# Patient Record
Sex: Male | Born: 2000
Health system: Southern US, Community
[De-identification: ages and names within clinical notes are randomized; demographics above are authoritative.]

---
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
## 2022-01-23 ENCOUNTER — Ambulatory Visit (INDEPENDENT_AMBULATORY_CARE_PROVIDER_SITE_OTHER): Payer: No Typology Code available for payment source | Admitting: Pediatrics

## 2022-01-23 ENCOUNTER — Other Ambulatory Visit (HOSPITAL_BASED_OUTPATIENT_CLINIC_OR_DEPARTMENT_OTHER): Payer: Self-pay

## 2022-01-23 ENCOUNTER — Encounter: Payer: Self-pay | Admitting: Pediatrics

## 2022-01-23 VITALS — BP 108/64 | HR 92 | Resp 20 | Wt 135.9 lb

## 2022-01-23 DIAGNOSIS — R0789 Other chest pain: Secondary | ICD-10-CM | POA: Diagnosis not present

## 2022-01-23 MED ORDER — CYCLOBENZAPRINE HCL 10 MG PO TABS
10.0000 mg | ORAL_TABLET | Freq: Three times a day (TID) | ORAL | 0 refills | Status: DC | PRN
  Filled 2022-01-23: qty 30, 10d supply, fill #0

## 2022-01-23 MED ORDER — IBUPROFEN 600 MG PO TABS
600.0000 mg | ORAL_TABLET | Freq: Four times a day (QID) | ORAL | 0 refills | Status: DC | PRN
  Filled 2022-01-23: qty 30, 8d supply, fill #0

## 2022-01-24 ENCOUNTER — Encounter: Payer: Self-pay | Admitting: Pediatrics

## 2022-01-24 DIAGNOSIS — R0789 Other chest pain: Secondary | ICD-10-CM | POA: Insufficient documentation

## 2022-01-24 NOTE — Progress Notes (Signed)
Calvin Salas is an 21 y.o. male who presents for evaluation of chest wall pain. Onset was 1 week ago. Symptoms have been stable since that time. The patient describes the pain as aching in the entire chest. Patient rates pain as a 3/10 in intensity. Associated symptoms are: none. Aggravating factors are: deep inspiration, exercise, lifting, palpation of chest. Alleviating factors are: cold and rest. Mechanism of injury: may have been injured by lifting weights. Previous visits for this problem: none. Evaluation to date: none. Treatment to date: OTC analgesics: somewhat effective.  The following portions of the patient's history were reviewed and updated as appropriate: allergies, current medications, past family history, past medical history, past social history, past surgical history and problem list.  Review of Systems Pertinent items are noted in HPI.    Objective:   BP 108/64  Wt 135 lb 14.4 oz  General appearance: alert and cooperative Nose: Nares normal. Septum midline. Mucosa normal. No drainage or sinus tenderness. Throat: lips, mucosa, and tongue normal; teeth and gums normal Neck: no adenopathy, supple, symmetrical, trachea midline and thyroid not enlarged, symmetric, no tenderness/mass/nodules Lungs: clear to auscultation bilaterally Chest wall: no tenderness, right sided costochondral tenderness, left sided costochondral tenderness Heart: regular rate and rhythm, S1, S2 normal, no murmur, click, rub or gallop Extremities: extremities normal, atraumatic, no cyanosis or edema Skin: Skin color, texture, turgor normal. No rashes or lesions Neurologic: Grossly normal    Assessment:    Costochondritis   Plan:    Chest wall injuries were discussed.  The sometimes prolonged recovery time was stressed. OTC analgesics as needed. Prescription NSAIDs per medication orders. Follow up as needed

## 2022-01-24 NOTE — Patient Instructions (Signed)
Costochondritis  Costochondritis is irritation and swelling (inflammation) of the tissue that connects the ribs to the breastbone (sternum). This tissue is called cartilage. Costochondritis causes pain in the front of the chest. Usually, the pain: Starts slowly. Is in more than one rib. What are the causes? The exact cause of this condition is not always known. It results from stress on the tissue in the affected area. The cause of this stress could be: Chest injury. Exercise or activity, such as lifting. Very bad coughing. What increases the risk? You are more likely to develop this condition if you: Are male. Are 30-40 years old. Recently started a new exercise or work activity. Have low levels of vitamin D. Have a condition that makes you cough often. What are the signs or symptoms? The main symptom of this condition is chest pain. The pain: Usually starts slowly and can be sharp or dull. Gets worse with deep breathing, coughing, or exercise. Gets better with rest. May be worse when you press on the affected area of your ribs and breastbone. How is this treated? This condition usually goes away on its own over time. Your doctor may prescribe an NSAID, such as ibuprofen. This can help reduce pain and inflammation. Treatment may also include: Resting and avoiding activities that make pain worse. Putting heat or ice on the painful area. Doing exercises to stretch your chest muscles. If these treatments do not help, your doctor may inject a numbing medicine to help relieve the pain. Follow these instructions at home: Managing pain, stiffness, and swelling     If told, put ice on the painful area. To do this: Put ice in a plastic bag. Place a towel between your skin and the bag. Leave the ice on for 20 minutes, 2-3 times a day. If told, put heat on the affected area. Do this as often as told by your doctor. Use the heat source that your doctor recommends, such as a moist heat  pack or a heating pad. Place a towel between your skin and the heat source. Leave the heat on for 20-30 minutes. Take off the heat if your skin turns bright red. This is very important if you cannot feel pain, heat, or cold. You may have a greater risk of getting burned. Activity Rest as told by your doctor. Do not do anything that makes your pain worse. This includes any activities that use chest, belly (abdomen), and side muscles. Do not lift anything that is heavier than 10 lb (4.5 kg), or the limit that you are told, until your doctor says that it is safe. Return to your normal activities as told by your doctor. Ask your doctor what activities are safe for you. General instructions Take over-the-counter and prescription medicines only as told by your doctor. Keep all follow-up visits as told by your doctor. This is important. Contact a doctor if: You have chills or a fever. Your pain does not go away or it gets worse. You have a cough that does not go away. Get help right away if: You are short of breath. You have very bad chest pain that is not helped by medicines, heat, or ice. These symptoms may be an emergency. Do not wait to see if the symptoms will go away. Get medical help right away. Call your local emergency services (911 in the [place] Do not drive yourself to the hospital. Summary Costochondritis is irritation and swelling (inflammation) of the tissue that connects the ribs to the breastbone (  sternum). This condition causes pain in the front of the chest. Treatment may include medicines, rest, heat or ice, and exercises. This information is not intended to replace advice given to you by your health care provider. Make sure you discuss any questions you have with your health care provider. Document Revised: 06/21/2021 Document Reviewed: 02/14/2019 Elsevier Patient Education  2023 Elsevier Inc.  

## 2022-02-01 ENCOUNTER — Other Ambulatory Visit (HOSPITAL_BASED_OUTPATIENT_CLINIC_OR_DEPARTMENT_OTHER): Payer: Self-pay

## 2022-05-25 ENCOUNTER — Ambulatory Visit (INDEPENDENT_AMBULATORY_CARE_PROVIDER_SITE_OTHER): Payer: 59 | Admitting: Clinical

## 2022-05-25 DIAGNOSIS — F4329 Adjustment disorder with other symptoms: Secondary | ICD-10-CM

## 2022-05-25 NOTE — BH Specialist Note (Signed)
Integrated Behavioral Health Initial In-Person Visit  MRN: [id number] Name: Calvin Salas  Number of [place] Clinician visits: 1- Initial Visit  Session Start time: [id number]  Session End time: 1252  Total time in minutes: 37  Types of Service: Individual psychotherapy  Interpretor:No. Interpretor Name and Language: n/a     Subjective: Calvin Salas is a 22 y.o. male accompanied by  self Patient was referred by Dr. Laurice Record for concerns with anxiety symptoms. Patient reports the following symptoms/concerns:  - itching when stressed and it's been increasing this past year, started about a year ago Duration of problem: months; Severity of problem: mild  Objective: Mood: Anxious and Euthymic and Affect: Appropriate Risk of harm to self or others: No plan to harm self or others   Patient and/or Family's Strengths/Protective Factors: Concrete supports in place (healthy food, safe environments, etc.) and Sense of purpose  Goals Addressed: Patient will: Increase knowledge and/or ability of: coping skills    Progress towards Goals: Achieved  Interventions: Interventions utilized: Mindfulness or Psychologist, educational and Psychoeducation and/or Health Education - Stress Responses (Psycho education); Strategies to practice: deep breathing, progressive muscle relaxation skills Standardized Assessments completed: PHQ-SADS    05/25/2022   12:20 PM 07/15/2021    1:11 AM 06/09/2020   11:02 PM  PHQ-SADS Last 3 Score only  PHQ-15 Score 0    Total GAD-7 Score 0    PHQ Adolescent Score 0 0 2    Patient and/or Family Response:  Calvin did not report any significant symptoms of anxiety or depression on the PHQ-SADS.  Calvin did verbalize specific social situations that triggered his itching and feelings of embarrassment when they occurred. - September 2023 - tripped at [facility], felt itchy; at work tried to [employer] something at work, didn't make it (social  situations)  Calvin reported he also felt itchy when driving today and traffic was getting bad  Calvin was open to learning more about stress responses and coping strategies that he can practice to decrease his stress responses.  Patient Centered Plan: Patient is on the following Treatment Plan(s):  Stress  Assessment: Patient currently experiencing increased stress responses in social situations or situations that may be overwhelming.   Patient may benefit from practicing relaxation and cognitive coping strategies to reduce his stress responses.  Plan: Follow up with behavioral health clinician on : No follow up, he will call if needed in the future. Behavioral recommendations:  - Review information given to him about typical responses to stressors and different coping strategies that he can try "From scale of 1-10, how likely are you to follow plan?": Calvin agreeable to plan above  Toney Rakes, LCSW

## 2022-06-21 DIAGNOSIS — L219 Seborrheic dermatitis, unspecified: Secondary | ICD-10-CM | POA: Diagnosis not present

## 2022-06-22 ENCOUNTER — Other Ambulatory Visit (HOSPITAL_COMMUNITY): Payer: Self-pay

## 2022-06-22 MED ORDER — KETOCONAZOLE 2 % EX SHAM
MEDICATED_SHAMPOO | CUTANEOUS | 3 refills | Status: DC
  Filled 2022-06-22 – 2022-06-27 (×2): qty 120, 30d supply, fill #0

## 2022-06-22 MED ORDER — BETAMETHASONE VALERATE 0.12 % EX FOAM
1.0000 | Freq: Two times a day (BID) | CUTANEOUS | 5 refills | Status: DC
  Filled 2022-06-22: qty 50, 30d supply, fill #0

## 2022-06-27 ENCOUNTER — Other Ambulatory Visit (HOSPITAL_BASED_OUTPATIENT_CLINIC_OR_DEPARTMENT_OTHER): Payer: Self-pay

## 2022-06-27 ENCOUNTER — Other Ambulatory Visit (HOSPITAL_COMMUNITY): Payer: Self-pay

## 2022-06-28 ENCOUNTER — Other Ambulatory Visit (HOSPITAL_BASED_OUTPATIENT_CLINIC_OR_DEPARTMENT_OTHER): Payer: Self-pay

## 2022-06-30 ENCOUNTER — Other Ambulatory Visit (HOSPITAL_COMMUNITY): Payer: Self-pay

## 2022-08-07 ENCOUNTER — Other Ambulatory Visit (HOSPITAL_COMMUNITY): Payer: Self-pay

## 2022-08-07 ENCOUNTER — Other Ambulatory Visit (HOSPITAL_BASED_OUTPATIENT_CLINIC_OR_DEPARTMENT_OTHER): Payer: Self-pay

## 2022-08-07 MED ORDER — BETAMETHASONE VALERATE 0.12 % EX FOAM
1.0000 | Freq: Two times a day (BID) | CUTANEOUS | 5 refills | Status: DC
  Filled 2022-08-07 – 2022-08-08 (×2): qty 50, 30d supply, fill #0

## 2022-08-08 ENCOUNTER — Other Ambulatory Visit (HOSPITAL_BASED_OUTPATIENT_CLINIC_OR_DEPARTMENT_OTHER): Payer: Self-pay

## 2022-08-25 ENCOUNTER — Ambulatory Visit (INDEPENDENT_AMBULATORY_CARE_PROVIDER_SITE_OTHER): Payer: 59 | Admitting: Pediatrics

## 2022-08-25 VITALS — BP 102/72 | Ht 69.5 in | Wt 132.3 lb

## 2022-08-25 DIAGNOSIS — Z0000 Encounter for general adult medical examination without abnormal findings: Secondary | ICD-10-CM | POA: Diagnosis not present

## 2022-08-25 DIAGNOSIS — Z1339 Encounter for screening examination for other mental health and behavioral disorders: Secondary | ICD-10-CM | POA: Diagnosis not present

## 2022-08-25 DIAGNOSIS — Z68.41 Body mass index (BMI) pediatric, 5th percentile to less than 85th percentile for age: Secondary | ICD-10-CM

## 2022-08-25 LAB — CBC WITH DIFFERENTIAL/PLATELET
Eosinophils Absolute: 312 cells/uL (ref 15–500)
HCT: 47.8 % (ref 38.5–50.0)
Lymphs Abs: 1183 cells/uL (ref 850–3900)
MCH: 29.3 pg (ref 27.0–33.0)
MCV: 84.5 fL (ref 80.0–100.0)
Monocytes Relative: 13.7 %
Neutrophils Relative %: 62.8 %
Platelets: 197 10*3/uL (ref 140–400)
RBC: 5.66 10*6/uL (ref 4.20–5.80)
WBC: 6.5 10*3/uL (ref 3.8–10.8)

## 2022-08-25 NOTE — Progress Notes (Unsigned)
No heart issues   STD testing   [phone]  Subjective:    Calvin Salas is a 22 y.o. male who presents for Medicare Annual/Subsequent preventive examination.   Preventive Screening-Counseling & Management  Tobacco Social History   Tobacco Use  Smoking Status Never  Smokeless Tobacco Never    Current Problems (verified) Patient Active Problem List   Diagnosis Date Noted   Encounter for annual physical exam 08/27/2022   BMI (body mass index), pediatric, 5% to less than 85% for age 19/12/2019    Medications Prior to Visit None   Current Medications (verified) Current Outpatient Medications  Medication Sig Dispense Refill   Betamethasone Valerate 0.12 % foam Apply 1 Application topically 2 (two) times daily. 50 g 5   clindamycin (CLEOCIN T) 1 % external solution Apply to the affected area once each morning. 60 mL 0   cyclobenzaprine (FLEXERIL) 10 MG tablet Take 1 tablet (10 mg total) by mouth 3 (three) times daily as needed for muscle spasms. 30 tablet 0   ibuprofen (ADVIL) 600 MG tablet Take 1 tablet (600 mg total) by mouth every 6 (six) hours as needed. 30 tablet 0   ketoconazole (NIZORAL) 2 % shampoo Apply to the entire scalp 2 - 3 times weekly let sit for 5 minutes 120 mL 3   tretinoin (RETIN-A) 0.025 % cream Apply a pea sized amount to face every night 45 g 0   No current facility-administered medications for this visit.     Allergies (verified) Patient has no known allergies.   PAST HISTORY   Social History Social History   Tobacco Use   Smoking status: Never   Smokeless tobacco: Never  Substance Use Topics   Alcohol use: Not on file    Are there smokers in your home (other than you)?  No  Risk Factors Current exercise habits: Home exercise routine includes stretching.  Dietary issues discussed: yes   Cardiac risk factors: none.  Depression Screen (Note: if answer to either of the following is "Yes", a more complete depression screening is  indicated)   Q1: Over the past two weeks, have you felt down, depressed or hopeless? No  Q2: Over the past two weeks, have you felt little interest or pleasure in doing things? No  Have you lost interest or pleasure in daily life? No  Do you often feel hopeless? No  Do you cry easily over simple problems? No   Immunization History  Administered Date(s) Administered   DTaP 06/16/2003, 08/25/2003, 10/20/2003, 08/14/2004, 10/31/2007   HIB (PRP-OMP) 06/16/2003, 08/25/2003, 10/20/2003, 07/18/2004   HPV 9-valent 03/30/2017, 04/11/2018   Hepatitis A 10/31/2007, 11/17/2008   Hepatitis B 07-27-02, 05/14/2003, 01/11/2004   IPV 06/16/2003, 08/25/2003, 04/12/2004, 10/31/2007   Influenza Nasal 02/23/2011, 01/23/2012   Influenza Split 01/31/2016, 02/05/2017, 02/18/2018   Influenza,inj,Quad PF,6+ Mos 02/23/2011, 01/23/2012, 02/18/2018, 02/20/2019, 03/09/2020, 01/19/2021   Influenza-Unspecified 02/23/2011, 01/23/2012   MMR 04/12/2004, 10/31/2007   Meningococcal Conjugate 12/28/2014, 05/27/2019   PFIZER(Purple Top)SARS-COV-2 Vaccination 09/16/2019, 10/09/2019   Pneumococcal Conjugate-13 06/16/2003, 08/25/2003, 10/20/2003, 04/12/2004   Tdap 12/28/2014   Varicella 07/18/2004, 10/31/2007    Screening Tests Health Maintenance  Topic Date Due   COVID-19 Vaccine (3 - 2023-24 season) 09/10/2022 (Originally 12/16/2021)   INFLUENZA VACCINE  11/16/2022   DTaP/Tdap/Td (7 - Td or Tdap) 12/27/2024   HPV VACCINES  Completed   Hepatitis C Screening  Completed   HIV Screening  Completed    All answers were reviewed with the patient and necessary referrals  were made:  Georgiann Hahn, MD   08/27/2022   History reviewed: allergies, current medications, past family history, past medical history, past social history, past surgical history, and problem list  Review of Systems Pertinent items are noted in HPI.    Objective:     Vision by Snellen chart: right eye:20/20, left eye:20/20 Blood pressure  102/72, height 5' 9.5" (1.765 m), weight 132 lb 4.8 oz (60 kg). Body mass index is 19.26 kg/m.  BP 102/72   Ht 5' 9.5" (1.765 m)   Wt 132 lb 4.8 oz (60 kg)   BMI 19.26 kg/m  General appearance: alert, cooperative, and no distress Head: Normocephalic, without obvious abnormality Eyes: negative Ears: normal TM's and external ear canals both ears Nose: no discharge Throat: lips, mucosa, and tongue normal; teeth and gums normal Neck: no adenopathy and supple, symmetrical, trachea midline Back: no kyphosis present, no scoliosis present, symmetric, no curvature. ROM normal. No CVA tenderness. Lungs: clear to auscultation bilaterally Heart: regular rate and rhythm, S1, S2 normal, no murmur, click, rub or gallop Abdomen: soft, non-tender; bowel sounds normal; no masses,  no organomegaly Male genitalia: normal Extremities: extremities normal, atraumatic, no cyanosis or edema Pulses: 2+ and symmetric Skin: Skin color, texture, turgor normal. No rashes or lesions Neurologic: Alert and oriented X 3, normal strength and tone. Normal symmetric reflexes. Normal coordination and gait     Assessment:     Well male      Plan:     During the course of the visit the patient was educated and counseled about appropriate screening and preventive services including:   Diabetes screening Nutrition counseling  Labs as ordered Orders Placed This Encounter  Procedures   CBC with Differential/Platelet   COMPLETE METABOLIC PANEL WITH GFR   TSH   T4, free   HIV antibody (with reflex)   Hepatitis C Antibody   Lipid Profile    Results for orders placed or performed in visit on 08/25/22 (from the past 72 hour(s))  CBC with Differential/Platelet     Status: None   Collection Time: 08/25/22 12:39 PM  Result Value Ref Range   WBC 6.5 3.8 - 10.8 Thousand/uL   RBC 5.66 4.20 - 5.80 Million/uL   Hemoglobin 16.6 13.2 - 17.1 g/dL   HCT 16.1 09.6 - 04.5 %   MCV 84.5 80.0 - 100.0 fL   MCH 29.3 27.0 -  33.0 pg   MCHC 34.7 32.0 - 36.0 g/dL   RDW 40.9 81.1 - 91.4 %   Platelets 197 140 - 400 Thousand/uL   MPV 9.6 7.5 - 12.5 fL   Neutro Abs 4,082 1,500 - 7,800 cells/uL   Lymphs Abs 1,183 850 - 3,900 cells/uL   Absolute Monocytes 891 200 - 950 cells/uL   Eosinophils Absolute 312 15 - 500 cells/uL   Basophils Absolute 33 0 - 200 cells/uL   Neutrophils Relative % 62.8 %   Total Lymphocyte 18.2 %   Monocytes Relative 13.7 %   Eosinophils Relative 4.8 %   Basophils Relative 0.5 %  COMPLETE METABOLIC PANEL WITH GFR     Status: Abnormal   Collection Time: 08/25/22 12:39 PM  Result Value Ref Range   Glucose, Bld 102 (H) 65 - 99 mg/dL    Comment: .            Fasting reference interval . For someone without known diabetes, a glucose value between 100 and 125 mg/dL is consistent with prediabetes and should be confirmed with a follow-up  test. .    BUN 16 7 - 20 mg/dL   Creat 9.60 4.54 - 0.98 mg/dL   eGFR 98 > OR = 60 [id number]/BJY/[id number]   BUN/Creatinine Ratio SEE NOTE: 6 - 22 (calc)    Comment:    Not Reported: BUN and Creatinine are within    reference range. .    Sodium 140 135 - 146 mmol/L   Potassium 3.9 3.8 - 5.1 mmol/L   Chloride 106 98 - 110 mmol/L   CO2 24 20 - 32 mmol/L   Calcium 10.0 8.9 - 10.4 mg/dL   Total Protein 7.4 6.3 - 8.2 g/dL   Albumin 4.9 3.6 - 5.1 g/dL   Globulin 2.5 2.1 - 3.5 g/dL (calc)   AG Ratio 2.0 1.0 - 2.5 (calc)   Total Bilirubin 1.3 (H) 0.2 - 1.1 mg/dL   Alkaline phosphatase (APISO) 65 46 - 169 U/L   AST 16 12 - 32 U/L   ALT 14 8 - 46 U/L  TSH     Status: None   Collection Time: 08/25/22 12:39 PM  Result Value Ref Range   TSH 1.19 0.50 - 4.30 mIU/L  T4, free     Status: None   Collection Time: 08/25/22 12:39 PM  Result Value Ref Range   Free T4 1.0 0.8 - 1.4 ng/dL  HIV antibody (with reflex)     Status: None   Collection Time: 08/25/22 12:39 PM  Result Value Ref Range   HIV 1&2 Ab, 4th Generation NON-REACTIVE NON-REACTIVE    Comment: HIV-1  antigen and HIV-1/HIV-2 antibodies were not detected. There is no laboratory evidence of HIV infection. Marland Kitchen PLEASE NOTE: This information has been disclosed to you from records whose confidentiality may be protected by state law.  If your state requires such protection, then the state law prohibits you from making any further disclosure of the information without the specific written consent of the person to whom it pertains, or as otherwise permitted by law. A general authorization for the release of medical or other information is NOT sufficient for this purpose. . For additional information please refer to http://education.[facility].com/faq/FAQ106 (This link is being provided for informational/ educational purposes only.) . Marland Kitchen The performance of this assay has not been clinically validated in patients less than 33 years old. .   Hepatitis C Antibody     Status: None   Collection Time: 08/25/22 12:39 PM  Result Value Ref Range   Hepatitis C Ab NON-REACTIVE NON-REACTIVE    Comment: . HCV antibody was non-reactive. There is no laboratory  evidence of HCV infection. . In most cases, no further action is required. However, if recent HCV exposure is suspected, a test for HCV RNA (test code 62130) is suggested. . For additional information please refer to http://education.[facility].com/faq/FAQ22v1 (This link is being provided for informational/ educational purposes only.) .   Lipid Profile     Status: None   Collection Time: 08/25/22 12:39 PM  Result Value Ref Range   Cholesterol 158 <170 mg/dL   HDL 63 >86 mg/dL   Triglycerides 54 <57 mg/dL   LDL Cholesterol (Calc) 82 <846 mg/dL (calc)    Comment: LDL-C is now calculated using the Martin-Hopkins  calculation, which is a validated novel method providing  better accuracy than the Friedewald equation in the  estimation of LDL-C.  Horald Pollen et al. Lenox Ahr. 9629;528(41): 2061-2068   (http://education.[facility].com/faq/FAQ164)    Total CHOL/HDL Ratio 2.5 <5.0 (calc)   Non-HDL Cholesterol (Calc) 95 <324 mg/dL (calc)  Comment: For patients with diabetes plus 1 major ASCVD risk  factor, treating to a non-HDL-C goal of <100 mg/dL  (LDL-C of <16 mg/dL) is considered a therapeutic  option.       Patient Instructions (the written plan) was given to the patient.  Medicare Attestation I have personally reviewed: The patient's medical and social history Their use of alcohol, tobacco or illicit drugs Their current medications and supplements The patient's functional ability including ADLs,fall risks, home safety risks, cognitive, and hearing and visual impairment Diet and physical activities Evidence for depression or mood disorders  The patient's weight, height, BMI, and visual acuity have been recorded in the chart.  I have made referrals, counseling, and provided education to the patient based on review of the above and I have provided the patient with a written personalized care plan for preventive services.     Georgiann Hahn, MD   08/27/2022

## 2022-08-26 LAB — COMPLETE METABOLIC PANEL WITH GFR
AG Ratio: 2 (calc) (ref 1.0–2.5)
ALT: 14 U/L (ref 8–46)
AST: 16 U/L (ref 12–32)
Albumin: 4.9 g/dL (ref 3.6–5.1)
Alkaline phosphatase (APISO): 65 U/L (ref 46–169)
BUN: 16 mg/dL (ref 7–20)
CO2: 24 mmol/L (ref 20–32)
Calcium: 10 mg/dL (ref 8.9–10.4)
Chloride: 106 mmol/L (ref 98–110)
Creat: 1.11 mg/dL (ref 0.60–1.24)
Globulin: 2.5 g/dL (calc) (ref 2.1–3.5)
Glucose, Bld: 102 mg/dL — ABNORMAL HIGH (ref 65–99)
Potassium: 3.9 mmol/L (ref 3.8–5.1)
Sodium: 140 mmol/L (ref 135–146)
Total Bilirubin: 1.3 mg/dL — ABNORMAL HIGH (ref 0.2–1.1)
Total Protein: 7.4 g/dL (ref 6.3–8.2)
eGFR: 98 mL/min/{1.73_m2} (ref >=60)

## 2022-08-26 LAB — CBC WITH DIFFERENTIAL/PLATELET
Absolute Monocytes: 891 cells/uL (ref 200–950)
Basophils Absolute: 33 cells/uL (ref 0–200)
Basophils Relative: 0.5 %
Eosinophils Relative: 4.8 %
Hemoglobin: 16.6 g/dL (ref 13.2–17.1)
MCHC: 34.7 g/dL (ref 32.0–36.0)
MPV: 9.6 fL (ref 7.5–12.5)
Neutro Abs: 4082 cells/uL (ref 1500–7800)
RDW: 12.7 % (ref 11.0–15.0)
Total Lymphocyte: 18.2 %

## 2022-08-26 LAB — TSH: TSH: 1.19 mIU/L (ref 0.50–4.30)

## 2022-08-26 LAB — LIPID PANEL
Cholesterol: 158 mg/dL (ref <170)
HDL: 63 mg/dL (ref >45)
LDL Cholesterol (Calc): 82 mg/dL (calc) (ref <110)
Non-HDL Cholesterol (Calc): 95 mg/dL (calc) (ref <120)
Total CHOL/HDL Ratio: 2.5 (calc) (ref <5.0)
Triglycerides: 54 mg/dL (ref <90)

## 2022-08-26 LAB — T4, FREE: Free T4: 1 ng/dL (ref 0.8–1.4)

## 2022-08-26 LAB — HEPATITIS C ANTIBODY: Hepatitis C Ab: NONREACTIVE

## 2022-08-26 LAB — HIV ANTIBODY (ROUTINE TESTING W REFLEX): HIV 1&2 Ab, 4th Generation: NONREACTIVE

## 2022-08-27 ENCOUNTER — Encounter: Payer: Self-pay | Admitting: Pediatrics

## 2022-08-27 DIAGNOSIS — Z0000 Encounter for general adult medical examination without abnormal findings: Secondary | ICD-10-CM | POA: Insufficient documentation

## 2022-08-27 NOTE — Patient Instructions (Signed)
Preventive Care 20-21 Years Old, Male ?Preventive care refers to lifestyle choices and visits with your health care provider that can promote health and wellness. At this stage in your life, you may start seeing a primary care physician instead of a pediatrician for your preventive care. Preventive care visits are also called wellness exams. ?What can I expect for my preventive care visit? ?Counseling ?During your preventive care visit, your health care provider may ask about your: ?Medical history, including: ?Past medical problems. ?Family medical history. ?Current health, including: ?Home life and relationship well-being. ?Emotional well-being. ?Sexual activity and sexual health. ?Lifestyle, including: ?Alcohol, nicotine or tobacco, and drug use. ?Access to firearms. ?Diet, exercise, and sleep habits. ?Sunscreen use. ?Motor vehicle safety. ?Physical exam ?Your health care provider may check your: ?Height and weight. These may be used to calculate your BMI (body mass index). BMI is a measurement that tells if you are at a healthy weight. ?Waist circumference. This measures the distance around your waistline. This measurement also tells if you are at a healthy weight and may help predict your risk of certain diseases, such as type 2 diabetes and high blood pressure. ?Heart rate and blood pressure. ?Body temperature. ?Skin for abnormal spots. ?What immunizations do I need? ? ?Vaccines are usually given at various ages, according to a schedule. Your health care provider will recommend vaccines for you based on your age, medical history, and lifestyle or other factors, such as travel or where you work. ?What tests do I need? ?Screening ?Your health care provider may recommend screening tests for certain conditions. This may include: ?Vision and hearing tests. ?Lipid and cholesterol levels. ?Hepatitis B test. ?Hepatitis C test. ?HIV (human immunodeficiency virus) test. ?STI (sexually transmitted infection) testing, if  you are at risk. ?Tuberculosis skin test. ?Talk with your health care provider about your test results, treatment options, and if necessary, the need for more tests. ?Follow these instructions at home: ?Eating and drinking ? ?Eat a healthy diet that includes fresh fruits and vegetables, whole grains, lean protein, and low-fat dairy products. ?Drink enough fluid to keep your urine pale yellow. ?Do not drink alcohol if: ?Your health care provider tells you not to drink. ?You are under the legal drinking age. In the [place] the legal drinking age is 21. ?If you drink alcohol: ?Limit how much you have to 0-2 drinks a day. ?Know how much alcohol is in your drink. In the [place] one drink equals one 12 oz bottle of beer (355 mL), one 5 oz glass of wine (148 mL), or one 1? oz glass of hard liquor (44 mL). ?Lifestyle ?Brush your teeth every morning and night with fluoride toothpaste. Floss one time each day. ?Exercise for at least 30 minutes 5 or more days of the week. ?Do not use any products that contain nicotine or tobacco. These products include cigarettes, chewing tobacco, and vaping devices, such as e-cigarettes. If you need help quitting, ask your health care provider. ?Do not use drugs. ?If you are sexually active, practice safe sex. Use a condom or other form of protection to prevent STIs. ?Find healthy ways to manage stress, such as: ?Meditation, yoga, or listening to music. ?Journaling. ?Talking to a trusted person. ?Spending time with friends and family. ?Safety ?Always wear your seat belt while driving or riding in a vehicle. ?Do not drive: ?If you have been drinking alcohol. Do not ride with someone who has been drinking. ?When you are tired or distracted. ?While texting. ?If you have been using   any mind-altering substances or drugs. ?Wear a helmet and other protective equipment during sports activities. ?If you have firearms in your house, make sure you follow all gun safety procedures. ?Seek help if you have  been bullied, physically abused, or sexually abused. ?Use the internet responsibly to avoid dangers, such as online bullying and online sex predators. ?What's next? ?Go to your health care provider once a year for an annual wellness visit. ?Ask your health care provider how often you should have your eyes and teeth checked. ?Stay up to date on all vaccines. ?This information is not intended to replace advice given to you by your health care provider. Make sure you discuss any questions you have with your health care provider. ?Document Revised: 09/29/2020 Document Reviewed: 09/29/2020 ?Elsevier Patient Education ? 2023 [employer] ? ?

## 2022-12-26 ENCOUNTER — Encounter: Payer: Self-pay | Admitting: Pediatrics

## 2023-01-25 ENCOUNTER — Other Ambulatory Visit (HOSPITAL_BASED_OUTPATIENT_CLINIC_OR_DEPARTMENT_OTHER): Payer: Self-pay

## 2023-08-31 ENCOUNTER — Ambulatory Visit: Payer: PRIVATE HEALTH INSURANCE | Admitting: Pediatrics

## 2023-08-31 VITALS — BP 114/66 | Ht 69.5 in | Wt 132.3 lb

## 2023-08-31 DIAGNOSIS — Z68.41 Body mass index (BMI) pediatric, 5th percentile to less than 85th percentile for age: Secondary | ICD-10-CM | POA: Diagnosis not present

## 2023-08-31 DIAGNOSIS — Z0000 Encounter for general adult medical examination without abnormal findings: Secondary | ICD-10-CM

## 2023-08-31 DIAGNOSIS — Z23 Encounter for immunization: Secondary | ICD-10-CM | POA: Diagnosis not present

## 2023-08-31 DIAGNOSIS — Z1339 Encounter for screening examination for other mental health and behavioral disorders: Secondary | ICD-10-CM

## 2023-08-31 NOTE — Patient Instructions (Signed)
Preventive Care 18-21 Years Old, Male Preventive care refers to lifestyle choices and visits with your health care provider that can promote health and wellness. At this stage in your life, you may start seeing a primary care physician instead of a pediatrician for your preventive care. Preventive care visits are also called wellness exams. What can I expect for my preventive care visit? Counseling During your preventive care visit, your health care provider may ask about your: Medical history, including: Past medical problems. Family medical history. Current health, including: Home life and relationship well-being. Emotional well-being. Sexual activity and sexual health. Lifestyle, including: Alcohol, nicotine or tobacco, and drug use. Access to firearms. Diet, exercise, and sleep habits. Sunscreen use. Motor vehicle safety. Physical exam Your health care provider may check your: Height and weight. These may be used to calculate your BMI (body mass index). BMI is a measurement that tells if you are at a healthy weight. Waist circumference. This measures the distance around your waistline. This measurement also tells if you are at a healthy weight and may help predict your risk of certain diseases, such as type 2 diabetes and high blood pressure. Heart rate and blood pressure. Body temperature. Skin for abnormal spots. What immunizations do I need?  Vaccines are usually given at various ages, according to a schedule. Your health care provider will recommend vaccines for you based on your age, medical history, and lifestyle or other factors, such as travel or where you work. What tests do I need? Screening Your health care provider may recommend screening tests for certain conditions. This may include: Vision and hearing tests. Lipid and cholesterol levels. Hepatitis B test. Hepatitis C test. HIV (human immunodeficiency virus) test. STI (sexually transmitted infection) testing, if  you are at risk. Tuberculosis skin test. Talk with your health care provider about your test results, treatment options, and if necessary, the need for more tests. Follow these instructions at home: Eating and drinking  Eat a healthy diet that includes fresh fruits and vegetables, whole grains, lean protein, and low-fat dairy products. Drink enough fluid to keep your urine pale yellow. Do not drink alcohol if: Your health care provider tells you not to drink. You are under the legal drinking age. In the [place] the legal drinking age is 21. If you drink alcohol: Limit how much you have to 0-2 drinks a day. Know how much alcohol is in your drink. In the [place] one drink equals one 12 oz bottle of beer (355 mL), one 5 oz glass of wine (148 mL), or one 1 oz glass of hard liquor (44 mL). Lifestyle Brush your teeth every morning and night with fluoride toothpaste. Floss one time each day. Exercise for at least 30 minutes 5 or more days of the week. Do not use any products that contain nicotine or tobacco. These products include cigarettes, chewing tobacco, and vaping devices, such as e-cigarettes. If you need help quitting, ask your health care provider. Do not use drugs. If you are sexually active, practice safe sex. Use a condom or other form of protection to prevent STIs. Find healthy ways to manage stress, such as: Meditation, yoga, or listening to music. Journaling. Talking to a trusted person. Spending time with friends and family. Safety Always wear your seat belt while driving or riding in a vehicle. Do not drive: If you have been drinking alcohol. Do not ride with someone who has been drinking. When you are tired or distracted. While texting. If you have been using   any mind-altering substances or drugs. Wear a helmet and other protective equipment during sports activities. If you have firearms in your house, make sure you follow all gun safety procedures. Seek help if you have  been bullied, physically abused, or sexually abused. Use the internet responsibly to avoid dangers, such as online bullying and online sex predators. What's next? Go to your health care provider once a year for an annual wellness visit. Ask your health care provider how often you should have your eyes and teeth checked. Stay up to date on all vaccines. This information is not intended to replace advice given to you by your health care provider. Make sure you discuss any questions you have with your health care provider. Document Revised: 09/29/2020 Document Reviewed: 09/29/2020 Elsevier Patient Education  2024 [employer].  

## 2023-09-01 ENCOUNTER — Encounter: Payer: Self-pay | Admitting: Pediatrics

## 2023-09-01 LAB — COMPREHENSIVE METABOLIC PANEL WITH GFR
AG Ratio: 2.3 (calc) (ref 1.0–2.5)
ALT: 15 U/L (ref 9–46)
AST: 18 U/L (ref 10–40)
Albumin: 5.2 g/dL — ABNORMAL HIGH (ref 3.6–5.1)
Alkaline phosphatase (APISO): 67 U/L (ref 36–130)
BUN: 17 mg/dL (ref 7–25)
CO2: 23 mmol/L (ref 20–32)
Calcium: 10.1 mg/dL (ref 8.6–10.3)
Chloride: 105 mmol/L (ref 98–110)
Creat: 1.04 mg/dL (ref 0.60–1.24)
Globulin: 2.3 g/dL (ref 1.9–3.7)
Glucose, Bld: 126 mg/dL — ABNORMAL HIGH (ref 65–99)
Potassium: 4 mmol/L (ref 3.5–5.3)
Sodium: 138 mmol/L (ref 135–146)
Total Bilirubin: 1.2 mg/dL (ref 0.2–1.2)
Total Protein: 7.5 g/dL (ref 6.1–8.1)
eGFR: 105 mL/min/{1.73_m2} (ref >=60)

## 2023-09-01 LAB — LIPID PANEL
Cholesterol: 166 mg/dL (ref <200)
HDL: 70 mg/dL (ref >=40)
LDL Cholesterol (Calc): 83 mg/dL
Non-HDL Cholesterol (Calc): 96 mg/dL (ref <130)
Total CHOL/HDL Ratio: 2.4 (calc) (ref <5.0)
Triglycerides: 46 mg/dL (ref <150)

## 2023-09-01 LAB — CBC
HCT: 49.6 % (ref 38.5–50.0)
Hemoglobin: 17 g/dL (ref 13.2–17.1)
MCH: 29.5 pg (ref 27.0–33.0)
MCHC: 34.3 g/dL (ref 32.0–36.0)
MCV: 86.1 fL (ref 80.0–100.0)
MPV: 9.8 fL (ref 7.5–12.5)
Platelets: 210 10*3/uL (ref 140–400)
RBC: 5.76 10*6/uL (ref 4.20–5.80)
RDW: 12.7 % (ref 11.0–15.0)
WBC: 4.9 10*3/uL (ref 3.8–10.8)

## 2023-09-01 LAB — TSH: TSH: 1.23 m[IU]/L (ref 0.40–4.50)

## 2023-09-01 LAB — T4, FREE: Free T4: 1.1 ng/dL (ref 0.8–1.4)

## 2023-09-01 NOTE — Progress Notes (Signed)
 Exercise --lifts weights  [phone]  Subjective:    Calvin Salas is a 23 y.o. male who presents for Medicare Annual/Subsequent preventive examination.   Preventive Screening-Counseling & Management  Tobacco Social History   Tobacco Use  Smoking Status Never  Smokeless Tobacco Never    Current Problems (verified) Patient Active Problem List   Diagnosis Date Noted   Encounter for annual physical exam 08/27/2022   BMI (body mass index), pediatric, 5% to less than 85% for age 69/12/2019    Medications Prior to Visit None   Current Medications (verified) No current outpatient medications on file.   No current facility-administered medications for this visit.     Allergies (verified) Patient has no known allergies.   PAST HISTORY   Social History Social History   Tobacco Use   Smoking status: Never   Smokeless tobacco: Never  Substance Use Topics   Alcohol use: Not on file    Are there smokers in your home (other than you)?  No  Risk Factors Current exercise habits: Home exercise routine includes stretching and treadmill.  Dietary issues discussed: yes   Cardiac risk factors: none.  Depression Screen (Note: if answer to either of the following is "Yes", a more complete depression screening is indicated)   Q1: Over the past two weeks, have you felt down, depressed or hopeless? No  Q2: Over the past two weeks, have you felt little interest or pleasure in doing things? No  Have you lost interest or pleasure in daily life? No  Do you often feel hopeless? No  Do you cry easily over simple problems? No   Immunization History  Administered Date(s) Administered   DTaP 06/16/2003, 08/25/2003, 10/20/2003, 08/14/2004, 10/31/2007   HIB (PRP-OMP) 06/16/2003, 08/25/2003, 10/20/2003, 07/18/2004   HPV 9-valent 03/30/2017, 04/11/2018   Hepatitis A 10/31/2007, 11/17/2008   Hepatitis B 10-Feb-2003, 05/14/2003, 01/11/2004   IPV 06/16/2003, 08/25/2003, 04/12/2004,  10/31/2007   Influenza Nasal 02/23/2011, 01/23/2012   Influenza Split 01/31/2016, 02/05/2017, 02/18/2018   Influenza,inj,Quad PF,6+ Mos 02/23/2011, 01/23/2012, 02/18/2018, 02/20/2019, 03/09/2020, 01/19/2021   Influenza-Unspecified 02/23/2011, 01/23/2012   MMR 04/12/2004, 10/31/2007   Meningococcal Conjugate 12/28/2014, 05/27/2019   PFIZER Comirnaty(Gray Top)Covid-19 Tri-Sucrose Vaccine 06/10/2020   PFIZER(Purple Top)SARS-COV-2 Vaccination 09/16/2019, 10/09/2019   Pneumococcal Conjugate-13 06/16/2003, 08/25/2003, 10/20/2003, 04/12/2004   Tdap 12/28/2014, 08/31/2023   Varicella 07/18/2004, 10/31/2007    Screening Tests Health Maintenance  Topic Date Due   Meningococcal B Vaccine (1 of 2 - Standard) Never done   COVID-19 Vaccine (4 - 2024-25 season) 12/17/2022   INFLUENZA VACCINE  11/16/2023   DTaP/Tdap/Td (8 - Td or Tdap) 08/30/2033   Pneumococcal Vaccine 48-70 Years old  Completed   HPV VACCINES  Completed   Hepatitis C Screening  Completed   HIV Screening  Completed    All answers were reviewed with the patient and necessary referrals were made:  Hadassah Letters, MD   09/01/2023   History reviewed: allergies, current medications, past family history, past medical history, past social history, past surgical history, and problem list  Review of Systems Pertinent items are noted in HPI.    Objective:     Vision by Snellen chart: right eye:20/20, left eye:20/20 Blood pressure 114/66, height 5' 9.5" (1.765 m), weight 132 lb 4.8 oz (60 kg). Body mass index is 19.26 kg/m.  BP 114/66   Ht 5' 9.5" (1.765 m)   Wt 132 lb 4.8 oz (60 kg)   BMI 19.26 kg/m  General appearance: alert, cooperative, and no distress  Head: Normocephalic, without obvious abnormality Eyes: conjunctivae/corneas clear. PERRL, EOM's intact. Fundi benign. Ears: normal TM's and external ear canals both ears Nose: Nares normal. Septum midline. Mucosa normal. No drainage or sinus tenderness. Throat: lips,  mucosa, and tongue normal; teeth and gums normal Neck: no adenopathy and supple, symmetrical, trachea midline Back: no kyphosis present, no scoliosis present, no skin lesions, erythema, or scars, symmetric, no curvature. ROM normal. No CVA tenderness. Lungs: clear to auscultation bilaterally Heart: regular rate and rhythm, S1, S2 normal, no murmur, click, rub or gallop Abdomen: soft, non-tender; bowel sounds normal; no masses,  no organomegaly Male genitalia: normal Extremities: extremities normal, atraumatic, no cyanosis or edema Pulses: 2+ and symmetric Skin: Skin color, texture, turgor normal. No rashes or lesions Neurologic: Grossly normal     Assessment:     Well male      Plan:     During the course of the visit the patient was educated and counseled about appropriate screening and preventive services including:   Orders Placed This Encounter  Procedures   Tdap vaccine greater than or equal to 7yo IM   Comprehensive metabolic panel with GFR   CBC   TSH   T4, free   Lipid panel      Patient Instructions (the written plan) was given to the patient.  Medicare Attestation I have personally reviewed: The patient's medical and social history Their use of alcohol, tobacco or illicit drugs Their current medications and supplements The patient's functional ability including ADLs,fall risks, home safety risks, cognitive, and hearing and visual impairment Diet and physical activities Evidence for depression or mood disorders  The patient's weight, height, BMI, and visual acuity have been recorded in the chart.  I have made referrals, counseling, and provided education to the patient based on review of the above and I have provided the patient with a written personalized care plan for preventive services.     Hadassah Letters, MD   09/01/2023
